# Patient Record
Sex: Male | Born: 1980 | Race: White | Hispanic: No | Marital: Single | Smoking: Never smoker
Health system: Southern US, Community
[De-identification: ages and names within clinical notes are randomized; demographics above are authoritative.]

## PROBLEM LIST (undated history)

## (undated) DIAGNOSIS — E785 Hyperlipidemia, unspecified: Secondary | ICD-10-CM

## (undated) DIAGNOSIS — L409 Psoriasis, unspecified: Secondary | ICD-10-CM

## (undated) DIAGNOSIS — I10 Essential (primary) hypertension: Secondary | ICD-10-CM

## (undated) DIAGNOSIS — G4733 Obstructive sleep apnea (adult) (pediatric): Secondary | ICD-10-CM

---

## 2018-12-10 ENCOUNTER — Other Ambulatory Visit: Payer: Self-pay

## 2018-12-10 ENCOUNTER — Ambulatory Visit: Payer: Self-pay | Admitting: Nurse Practitioner

## 2018-12-10 VITALS — BP 130/80 | HR 80 | Temp 98.2°F | Resp 20 | Wt 276.0 lb

## 2018-12-10 DIAGNOSIS — H1032 Unspecified acute conjunctivitis, left eye: Secondary | ICD-10-CM

## 2018-12-10 DIAGNOSIS — J019 Acute sinusitis, unspecified: Secondary | ICD-10-CM

## 2018-12-10 DIAGNOSIS — B9789 Other viral agents as the cause of diseases classified elsewhere: Secondary | ICD-10-CM

## 2018-12-10 MED ORDER — FLUTICASONE PROPIONATE 50 MCG/ACT NA SUSP: 2.0000 | Freq: Every day | NASAL | 0 refills | Status: DC

## 2018-12-10 MED ORDER — POLYMYXIN B-TRIMETHOPRIM 10000-0.1 UNIT/ML-% OP SOLN: 2.0000 [drp] | OPHTHALMIC | 0 refills | Status: AC

## 2018-12-10 MED ORDER — CETIRIZINE HCL 10 MG PO TABS: 10.0000 mg | ORAL_TABLET | Freq: Every day | ORAL | 0 refills | Status: DC

## 2018-12-10 MED ORDER — PSEUDOEPH-BROMPHEN-DM 30-2-10 MG/5ML PO SYRP: 5.0000 mL | ORAL_SOLUTION | Freq: Four times a day (QID) | ORAL | 0 refills | Status: AC | PRN

## 2018-12-10 NOTE — Patient Instructions (Addendum)
Sinusitis, Adult  -Take medication as prescribed. -Ibuprofen or Tylenol for pain, fever, or general discomfort. -Increase fluids. -Get plenty of rest. -Sleep elevated on at least 2 pillows at bedtime to help with cough. -Use a humidifier or vaporizer when at home and during sleep. -May use a teaspoon of honey or over-the-counter cough drops to help with cough. -May use normal saline nasal spray to help with nasal congestion throughout the day. -Follow-up if symptoms do not improve within the next 7-10 days.  Conjunctivitis:  -Use eyedrops as prescribed. -Apply a clean, cool compress to your eye for 10-20 minutes, 3-4 times a day. -Strict hand hygiene. -Do not rub or scratch the eyes. -Follow up with your PCP if symptoms do not improve or if there is a change in vision.   Sinusitis is inflammation of your sinuses. Sinuses are hollow spaces in the bones around your face. Your sinuses are located:  Around your eyes.  In the middle of your forehead.  Behind your nose.  In your cheekbones. Mucus normally drains out of your sinuses. When your nasal tissues become inflamed or swollen, mucus can become trapped or blocked. This allows bacteria, viruses, and fungi to grow, which leads to infection. Most infections of the sinuses are caused by a virus. Sinusitis can develop quickly. It can last for up to 4 weeks (acute) or for more than 12 weeks (chronic). Sinusitis often develops after a cold. What are the causes? This condition is caused by anything that creates swelling in the sinuses or stops mucus from draining. This includes:  Allergies.  Asthma.  Infection from bacteria or viruses.  Deformities or blockages in your nose or sinuses.  Abnormal growths in the nose (nasal polyps).  Pollutants, such as chemicals or irritants in the air.  Infection from fungi (rare). What increases the risk? You are more likely to develop this condition if you:  Have a weak body defense  system (immune system).  Do a lot of swimming or diving.  Overuse nasal sprays.  Smoke. What are the signs or symptoms? The main symptoms of this condition are pain and a feeling of pressure around the affected sinuses. Other symptoms include:  Stuffy nose or congestion.  Thick drainage from your nose.  Swelling and warmth over the affected sinuses.  Headache.  Upper toothache.  A cough that may get worse at night.  Extra mucus that collects in the throat or the back of the nose (postnasal drip).  Decreased sense of smell and taste.  Fatigue.  A fever.  Sore throat.  Bad breath. How is this diagnosed? This condition is diagnosed based on:  Your symptoms.  Your medical history.  A physical exam.  Tests to find out if your condition is acute or chronic. This may include: ? Checking your nose for nasal polyps. ? Viewing your sinuses using a device that has a light (endoscope). ? Testing for allergies or bacteria. ? Imaging tests, such as an MRI or CT scan. In rare cases, a bone biopsy may be done to rule out more serious types of fungal sinus disease. How is this treated? Treatment for sinusitis depends on the cause and whether your condition is chronic or acute.  If caused by a virus, your symptoms should go away on their own within 10 days. You may be given medicines to relieve symptoms. They include: ? Medicines that shrink swollen nasal passages (topical intranasal decongestants). ? Medicines that treat allergies (antihistamines). ? A spray that eases inflammation of  the nostrils (topical intranasal corticosteroids). ? Rinses that help get rid of thick mucus in your nose (nasal saline washes).  If caused by bacteria, your health care provider may recommend waiting to see if your symptoms improve. Most bacterial infections will get better without antibiotic medicine. You may be given antibiotics if you have: ? A severe infection. ? A weak immune system.   If caused by narrow nasal passages or nasal polyps, you may need to have surgery. Follow these instructions at home: Medicines  Take, use, or apply over-the-counter and prescription medicines only as told by your health care provider. These may include nasal sprays.  If you were prescribed an antibiotic medicine, take it as told by your health care provider. Do not stop taking the antibiotic even if you start to feel better. Hydrate and humidify   Drink enough fluid to keep your urine pale yellow. Staying hydrated will help to thin your mucus.  Use a cool mist humidifier to keep the humidity level in your home above 50%.  Inhale steam for 10-15 minutes, 3-4 times a day, or as told by your health care provider. You can do this in the bathroom while a hot shower is running.  Limit your exposure to cool or dry air. Rest  Rest as much as possible.  Sleep with your head raised (elevated).  Make sure you get enough sleep each night. General instructions   Apply a warm, moist washcloth to your face 3-4 times a day or as told by your health care provider. This will help with discomfort.  Wash your hands often with soap and water to reduce your exposure to germs. If soap and water are not available, use hand sanitizer.  Do not smoke. Avoid being around people who are smoking (secondhand smoke).  Keep all follow-up visits as told by your health care provider. This is important. Contact a health care provider if:  You have a fever.  Your symptoms get worse.  Your symptoms do not improve within 10 days. Get help right away if:  You have a severe headache.  You have persistent vomiting.  You have severe pain or swelling around your face or eyes.  You have vision problems.  You develop confusion.  Your neck is stiff.  You have trouble breathing. Summary  Sinusitis is soreness and inflammation of your sinuses. Sinuses are hollow spaces in the bones around your face.  This  condition is caused by nasal tissues that become inflamed or swollen. The swelling traps or blocks the flow of mucus. This allows bacteria, viruses, and fungi to grow, which leads to infection.  If you were prescribed an antibiotic medicine, take it as told by your health care provider. Do not stop taking the antibiotic even if you start to feel better.  Keep all follow-up visits as told by your health care provider. This is important. This information is not intended to replace advice given to you by your health care provider. Make sure you discuss any questions you have with your health care provider. Document Released: 09/12/2005 Document Revised: 02/12/2018 Document Reviewed: 02/12/2018 Else[employer] Interactive Patient Education  2019 [employer]  Bacterial Conjunctivitis, Adult Bacterial conjunctivitis is an infection of the clear membrane that covers the white part of your eye and the inner surface of your eyelid (conjunctiva). When the blood vessels in your conjunctiva become inflamed, your eye becomes red or pink, and it will probably feel itchy. Bacterial conjunctivitis spreads very easily from person to person (is  contagious). It also spreads easily from one eye to the other eye. What are the causes? This condition is caused by bacteria. You may get the infection if you come into close contact with:  A person who is infected with the bacteria.  Items that are contaminated with the bacteria, such as a face towel, contact lens solution, or eye makeup. What increases the risk? You are more likely to develop this condition if you:  Are exposed to other people who have the infection.  Wear contact lenses.  Have a sinus infection.  Have had a recent eye injury or surgery.  Have a weak body defense system (immune system).  Have a medical condition that causes dry eyes. What are the signs or symptoms? Symptoms of this condition include:  Thick, yellowish discharge from the eye.  This may turn into a crust on the eyelid overnight and cause your eyelids to stick together.  Tearing or watery eyes.  Itchy eyes.  Burning feeling in your eyes.  Eye redness.  Swollen eyelids.  Blurred vision. How is this diagnosed? This condition is diagnosed based on your symptoms and medical history. Your health care provider may also take a sample of discharge from your eye to find the cause of your infection. This is rarely done. How is this treated? This condition may be treated with:  Antibiotic eye drops or ointment to clear the infection more quickly and prevent the spread of infection to others.  Oral antibiotic medicines to treat infections that do not respond to drops or ointments or that last longer than 10 days.  Cool, wet cloths (cool compresses) placed on the eyes.  Artificial tears applied 2-6 times a day. Follow these instructions at home: Medicines  Take or apply your antibiotic medicine as told by your health care provider. Do not stop taking or applying the antibiotic even if you start to feel better.  Take or apply over-the-counter and prescription medicines only as told by your health care provider.  Be very careful to avoid touching the edge of your eyelid with the eye-drop bottle or the ointment tube when you apply medicines to the affected eye. This will keep you from spreading the infection to your other eye or to other people. Managing discomfort  Gently wipe away any drainage from your eye with a warm, wet washcloth or a cotton ball.  Apply a clean, cool compress to your eye for 10-20 minutes, 3-4 times a day. General instructions  Do not wear contact lenses until the inflammation is gone and your health care provider says it is safe to wear them again. Ask your health care provider how to sterilize or replace your contact lenses before you use them again. Wear glasses until you can resume wearing contact lenses.  Avoid wearing eye makeup until  the inflammation is gone. Throw away any old eye cosmetics that may be contaminated.  Change or wash your pillowcase every day.  Do not share towels or washcloths. This may spread the infection.  Wash your hands often with soap and water. Use paper towels to dry your hands.  Avoid touching or rubbing your eyes.  Do not drive or use heavy machinery if your vision is blurred. Contact a health care provider if:  You have a fever.  Your symptoms do not get better after 10 days. Get help right away if you have:  A fever and your symptoms suddenly get worse.  Severe pain when you move your eye.  Facial pain,  redness, or swelling.  Sudden loss of vision. Summary  Bacterial conjunctivitis is an infection of the clear membrane that covers the white part of your eye and the inner surface of your eyelid (conjunctiva).  Bacterial conjunctivitis spreads very easily from person to person (is contagious).  Wash your hands often with soap and water. Use paper towels to dry your hands.  Take or apply your antibiotic medicine as told by your health care provider. Do not stop taking or applying the antibiotic even if you start to feel better.  Contact a health care provider if you have a fever or your symptoms do not get better after 10 days. This information is not intended to replace advice given to you by your health care provider. Make sure you discuss any questions you have with your health care provider. Document Released: 09/12/2005 Document Revised: 04/18/2018 Document Reviewed: 04/18/2018 Elsevier Interactive Patient Education  2019 [employer]

## 2018-12-10 NOTE — Progress Notes (Signed)
MRN: [id number] DOB: May 08, 1981  Subjective:   Kevin Rowland is a 38 y.o. male presenting for chief complaint of Cough (4 DAYS); SNEEZING (4 DAYS); Eye Problem (1 DAY, LEFT EYE, ITCHY, PINK, DRAINAGE, CRUSTED ); and Nasal Congestion (4 DAYS )   Reports a 4 day history of sinus headache, sinus congestion , sinus pain, sore throat and productive cough, and a 1 day history of left eye redness, itching, crusting, drainage. Has tried Mucinex  for relief. Denies fever, ear fullness, ear drainage, difficulty swallowing, inability to swallow, voice change, wheezing, shortness of breath, chest tightness and chest pain, chills, decreased appetite, nausea, vomiting, abdominal pain and diarrhea. Has not had had sick contacts with influenza or strep. Denies history of seasonal allergies,denies history of asthma. Patient has had flu shot this season. Denies smoking.  Patient currently takes Humira for colitis. Denies recent travel. Denies any other aggravating or relieving factors, no other questions or concerns.  Review of Systems  Constitutional: Positive for malaise/fatigue. Negative for chills and fever.  HENT: Positive for congestion, sinus pain and sore throat. Negative for ear discharge and ear pain.        +postnasal drainage  Eyes: Positive for blurred vision, discharge and redness. Negative for double vision, photophobia and pain.       Left eye, crusting  Respiratory: Positive for cough and sputum production. Negative for shortness of breath and wheezing.   Cardiovascular: Negative.   Skin: Negative.   Neurological: Positive for speech change and headaches. Negative for dizziness, tingling, tremors, sensory change and focal weakness.  Endo/Heme/Allergies: Negative for environmental allergies.    Kevin Rowland has a current medication list which includes the following prescription(s): adalimumab and pravastatin. Also has no allergies on file.  Kevin Rowland  has no past medical history on file. Also  has no  past surgical history on file.   Objective:   Vitals: BP 130/80 (BP Location: Right Arm, Patient Position: Sitting, Cuff Size: Large)   Pulse 80   Temp 98.2 F (36.8 C) (Oral)   Resp 20   Wt 276 lb (125.2 kg)   SpO2 98%   Physical Exam Vitals signs reviewed.  Constitutional:      General: He is not in acute distress. HENT:     Head: Normocephalic.     Right Ear: Tympanic membrane, ear canal and external ear normal.     Left Ear: Tympanic membrane, ear canal and external ear normal.     Nose: Mucosal edema, congestion (moderate) and rhinorrhea ( clear nasal drainage) present.     Right Turbinates: Enlarged and swollen.     Left Turbinates: Enlarged and swollen.     Right Sinus: Maxillary sinus tenderness and frontal sinus tenderness present.     Left Sinus: Maxillary sinus tenderness and frontal sinus tenderness present.     Mouth/Throat:     Lips: Pink.     Mouth: Mucous membranes are moist.     Pharynx: Uvula midline. Posterior oropharyngeal erythema present. No pharyngeal swelling or oropharyngeal exudate.     Tonsils: No tonsillar exudate. Swelling: 0 on the right. 0 on the left.  Eyes:     General: Lids are normal. Lids are everted, no foreign bodies appreciated. Vision grossly intact. Gaze aligned appropriately.        Left eye: Discharge ( yellow discharge to inner canthus) present.    Extraocular Movements: Extraocular movements intact.     Conjunctiva/sclera:     Left eye: Left conjunctiva is injected.  Pupils: Pupils are equal, round, and reactive to light.  Neurological:     Mental Status: He is alert.     Assessment and Plan :   Exam findings, diagnosis etiology and medication use and indications reviewed with patient. Follow- Up and discharge instructions provided. No emergent/urgent issues found on exam. Patient's URI symptoms and physical exam findings are consistent with that of viral etiology.  Patient does not display fever, purulent nasal drainage,  nasal obstruction, or facial pain to date. I will provide the patient with symptomatic treatment for his cough and nasal congestion to include Zyrtec, Bromfed and Flonase. I am going to treat the patient's bacterial conjunctivitis with polytrim.  Patient will treat both eyes and follow additional instructions provided at home.  Discussed with patient that antibiotics are not necessary at this time and felt his symptoms could respond to symptomatic treatment at this time.  Patient is in agreement with this plan.  Informed the patient that if her symptoms do not improve within the next 7-10 days or worsen, an antibiotic may be appropriate at that time. I would recommend Augmentin for 7 days if needed.  Patient education was provided. Patient verbalized understanding of information provided and agrees with plan of care (POC), all questions answered. The patient is advised to call or return to clinic if conditiondoes not see an improvement in symptoms, or to seek the care of the closest emergency department if conditionworsens with the above plan.  1. Acute viral sinusitis  - fluticasone (FLONASE) 50 MCG/ACT nasal spray; Place 2 sprays into both nostrils daily for 10 days.  Dispense: 16 g; Refill: 0 - brompheniramine-pseudoephedrine-DM 30-2-10 MG/5ML syrup; Take 5 mLs by mouth 4 (four) times daily as needed for up to 7 days.  Dispense: 150 mL; Refill: 0 - cetirizine (ZYRTEC) 10 MG tablet; Take 1 tablet (10 mg total) by mouth daily for 30 days.  Dispense: 30 tablet; Refill: 0 -Take medication as prescribed. -Ibuprofen or Tylenol for pain, fever, or general discomfort. -Increase fluids. -Get plenty of rest. -Sleep elevated on at least 2 pillows at bedtime to help with cough. -Use a humidifier or vaporizer when at home and during sleep. -May use a teaspoon of honey or over-the-counter cough drops to help with cough. -May use normal saline nasal spray to help with nasal congestion throughout the  day. -Follow-up if symptoms do not improve or rapidly worsen within the next 7-10 days.  2. Acute bacterial conjunctivitis of left eye  - trimethoprim-polymyxin b (POLYTRIM) ophthalmic solution; Place 2 drops into both eyes every 4 (four) hours for 7 days.  Dispense: 10 mL; Refill: 0 -Use eyedrops as prescribed. -Apply a clean, cool compress to your eye for 10-20 minutes, 3-4 times a day. -Strict hand hygiene. -Do not rub or scratch the eyes. -Follow up with your PCP if symptoms do not improve or if there is a change in vision.

## 2018-12-12 ENCOUNTER — Telehealth: Payer: Self-pay

## 2018-12-12 NOTE — Telephone Encounter (Signed)
Patient states he is feeling better

## 2019-09-05 ENCOUNTER — Encounter: Payer: Self-pay | Admitting: Neurology

## 2019-09-05 ENCOUNTER — Other Ambulatory Visit: Payer: Self-pay

## 2019-09-05 ENCOUNTER — Ambulatory Visit (INDEPENDENT_AMBULATORY_CARE_PROVIDER_SITE_OTHER): Payer: 59 | Admitting: Neurology

## 2019-09-05 VITALS — BP 142/92 | HR 88 | Temp 97.8°F | Ht 68.0 in | Wt 291.0 lb

## 2019-09-05 DIAGNOSIS — G4733 Obstructive sleep apnea (adult) (pediatric): Secondary | ICD-10-CM

## 2019-09-05 DIAGNOSIS — Z9989 Dependence on other enabling machines and devices: Secondary | ICD-10-CM

## 2019-09-05 NOTE — Progress Notes (Signed)
SLEEP MEDICINE CLINIC    Provider:  Melvyn Novas, MD  Primary Care Physician:  Lonell Face, MD [address] [place] [address]     Referring Provider: Irene Limbo, D[address] [place],  [place] [address]          Chief Complaint according to patient   Patient presents with:    . New Patient (Initial Visit)           HISTORY OF PRESENT ILLNESS:  Kevin Rowland is a 38 y.o. year old Caucasian male patient seen here on 09/05/2019    Chief concern according to patient :   Mr. Kevin Rowland underwent sleep testing on 18 November 2010 at [facility] neurology at the time Mr. Kevin Rowland as the attending technician's physician was C. Para March, DO he has also been followed by Ephraim Hamburger, MD. A family member had told him about his loud snoring so he was referred for evaluation.  The first sleep  Sleep study 2008.  The  2012 sleep study showed a sleep efficiency of 90% no significant hypoxemia.  He did have however very low nadir of oxygen saturation in rem sleep and in supine sleep.  The nadir was 61% for supine REM sleep and 62% for supine non-REM sleep.  His AHI was very high at 92.7 events per hour of sleep REM AHI was 102.9, the vast majority were hypopneas and obstructive apneas there was a small percentage 11.6 of mixed apneas.  He was titrated beginning at 4 and ending at 9 cm water pressure with an AHI of 4.5 /h.  He uses nasal pillows, is a restless sleeper and loses air seal. He developed a sore spot at the bottom pf the left nostril and looks for an alternative.   Mr. Kevin Rowland is a highly compliant CPAP user and the data collection including today 09-05-2019 showed 30 out of 30 days of CPAP use each of those days over 4 hours with an average of 6 hours 41 minutes.   He is using an AutoSet 10 machine with a serial #[id number].  The minimum pressure is set at 4 and the maximum pressure at 16 was 2 cm EPR the 95th percentile pressure is  14.9.   He still has a residual AHI of 3.2 which is satisfying but this is mostly obstructive apnea is 2.7 out of these AHI.  A further increase and probably a higher minimum pressure is indicated.  We will also try to find the most comfortable full facemask for him today.    I have the pleasure of seeing Kevin Rowland today, a right-handed White or Caucasian male with a possible sleep disorder.  He  has no past medical history on file.. Left eye dominance.    Family medical /sleep history: no other family member on CPAP with OSA, insomnia, sleep walkers.   Social history:  Patient is working as Education officer, environmental and lives in a household alone. The patient currently works daytime .Pets are not present. Tobacco use : never .  ETOH use; seldomly  Caffeine intake in form of Coffee(none ) Soda( 2-3 ( Tea ( sweet ) no energy drinks. Regular exercise in form of N/A  Hobbies : N/A    Sleep habits are as follows: The patient's dinner time is between 7 PM. The patient goes to bed at 12 PM and continues to sleep for 7 hours.The preferred sleep position is laterally, prone and sometimes supine.  with the support of 2 pillow.  He sleeps in a queen bed. Dreams are reportedly inrequent/vivid.   7.30 AM is the usual rise time. The patient wakes up with an alarm.  He reports not feeling refreshed or restored in AM, with symptoms such as dry mouth and residual fatigue. He feels sleepy after lunch. He is a mouth breather - and uses a humidifier.   Naps are taken rarely.   Review of Systems: Out of a complete 14 system review, the patient complains of only the following symptoms, and all other reviewed systems are negative.:   Obesity. No SOB in daytime.  Fatigue, sleepiness , snoring,   How likely are you to doze in the following situations: 0 = not likely, 1 = slight chance, 2 = moderate chance, 3 = high chance   Sitting and Reading? Watching Television? Sitting inactive in a public place (theater or  meeting)? As a passenger in a car for an hour without a break? Lying down in the afternoon when circumstances permit? Sitting and talking to someone? Sitting quietly after lunch without alcohol? In a car, while stopped for a few minutes in traffic?   Total = 3/ 24 points   FSS endorsed at 12/ 63 points.   Social History   Socioeconomic History  . Marital status: Unknown    Spouse name: Not on file  . Number of children: Not on file  . Years of education: Not on file  . Highest education level: Not on file  Occupational History  . Not on file  Tobacco Use  . Smoking status: Never Smoker  . Smokeless tobacco: Never Used  Substance and Sexual Activity  . Alcohol use: Not Currently  . Drug use: Not Currently  . Sexual activity: Not on file  Other Topics Concern  . Not on file  Social History Narrative  . Not on file   Social Determinants of Health   Financial Resource Strain:   . Difficulty of Paying Living Expenses: Not on file  Food Insecurity:   . Worried About Charity fundraiser in the Last Year: Not on file  . Ran Out of Food in the Last Year: Not on file  Transportation Needs:   . Lack of Transportation (Medical): Not on file  . Lack of Transportation (Non-Medical): Not on file  Physical Activity:   . Days of Exercise per Week: Not on file  . Minutes of Exercise per Session: Not on file  Stress:   . Feeling of Stress : Not on file  Social Connections:   . Frequency of Communication with Friends and Family: Not on file  . Frequency of Social Gatherings with Friends and Family: Not on file  . Attends Religious Services: Not on file  . Active Member of Clubs or Organizations: Not on file  . Attends Archivist Meetings: Not on file  . Marital Status: Not on file    No family history on file.  No past medical history on file.     Current Outpatient Medications on File Prior to Visit  Medication Sig Dispense Refill  . Adalimumab (HUMIRA PEN) 40  MG/0.4ML PNKT Inject 40 mg into the skin.    Marland Kitchen icosapent Ethyl (VASCEPA) 1 g capsule Take 2 g by mouth 2 (two) times daily.    Marland Kitchen loratadine (CLARITIN) 10 MG tablet Take by mouth.    . Multiple Vitamin (MULTIVITAMIN) tablet Take 1 tablet by mouth daily.    . pravastatin (PRAVACHOL) 40  MG tablet Take 40 mg by mouth daily.     No current facility-administered medications on file prior to visit.    Physical exam:  Today's Vitals   09/05/19 1508  BP: (!) 142/92  Pulse: 88  Temp: 97.8 F (36.6 C)  Weight: 291 lb (132 kg)  Height: 5\' 8"  (1.727 m)   Body mass index is 44.25 kg/m.   Wt Readings from Last 3 Encounters:  09/05/19 291 lb (132 kg)  12/10/18 276 lb (125.2 kg)     Ht Readings from Last 3 Encounters:  09/05/19 5\' 8"  (1.727 m)      General: The patient is awake, alert and appears not in acute distress. The patient is well groomed. Head: Normocephalic, atraumatic. Neck is supple. Mallampati 4 ,  neck circumference:20.5  inches .  Nasal airflow restricted patent.   Retrognathia is present.  Dental status:   Cardiovascular:  Regular rate and cardiac rhythm by pulse,  without distended neck veins. Respiratory: Lungs are clear to auscultation.  Skin:  Without evidence of ankle edema, or rash. Trunk: The patient's posture is erect.   Neurologic exam : The patient is awake and alert, oriented to place and time.   Memory subjective described as intact.  Attention span & concentration ability appears normal.  Speech is fluent,  without  dysarthria, dysphonia or aphasia.  Mood and affect are appropriate.   Cranial nerves: no loss of smell or taste reported  Pupils are equal and briskly reactive to light. Funduscopic exam: deferred.   Extraocular movements in vertical and horizontal planes were intact and without nystagmus. No Diplopia. Visual fields by finger perimetry are intact. Hearing was intact to soft voice and finger rubbing.    Facial sensation intact to fine  touch.  Facial motor strength is symmetric and tongue and uvula move midline.  Neck ROM : rotation, tilt and flexion extension were normal for age and shoulder shrug was symmetrical.    Motor exam:  Symmetric bulk, tone and ROM.   Normal tone without cog wheeling, symmetric grip strength . Sensory:  Fine touch, pinprick and vibration were tested  and  normal.  Proprioception tested in the upper extremities was normal. Coordination: Rapid alternating movements in the fingers/hands were of normal speed.  The Finger-to-nose maneuver was intact without evidence of ataxia, dysmetria or tremor.   Gait and station: Patient could rise unassisted from a seated position.  Toe and heel walk were deferred.  Deep tendon reflexes: in the  upper and lower extremities are symmetric and intact.  Babinski response was deferred.       After spending a total time of 35 minutes face to face and additional time for physical and neurologic examination, review of laboratory studies,  personal review of imaging studies, reports and results of other testing and review of referral information / records as far as provided in visit, I have established the following assessments:  1) mainly curled reason as to get new supplies for Kevin Rowland but also to find the best possible interface to not further aggravate the blister that has developed under his left nostril and at the angle of the nose.  He has been a highly compliant CPAP and I explained that he can try a full facemask to give that area with some rest that does not mean that he could not return to a nasal pillow or nasal cradle in the future.  He was originally only titrated to 9 cmH2O but he is now using  it 10 cm pressure on his auto titration device his baseline spoke of severe apnea and at that time he endorsed the Epworth Sleepiness Scale at 15 points much higher than today.  CPAP has definitely benefited him and he is not looking for alternative treatments but for  a more comfortable CPAP experience.     My Plan is to proceed with:  I would concentrate today on the mask fit with the patient and I will involve my sleep lab into this question.   I will increase his upper limit of CPAP to 18 cm water.    In addition I will refer him for a medical weight management with Dr. Orlene Plum and company.  He has had nutritional advice has seen a nutritionist after referral by primary care.  He is well aware that obesity and inflammation are closely correlated but he has not gotten the encouragement and coaching that would help him to lose weight on a long-lasting lifestyle change.  I would like to thank Fischer, Rockwell Alexandria, MD and Irene Limbo, [address] [place],  [place] [address] for allowing me to meet with and to take care of this pleasant patient.   In short, Kevin Rowland is presenting with nasal blisters - related to CPAP use. ,   Electronically signed by: Melvyn Novas, MD 09/05/2019 3:21 PM  [facility] Sleep Board certified by The [employer] of Sleep Medicine and Diplomate of the [employer] of Sleep Medicine. Board certified In Neurology through the ABPN, Fellow of the [employer] of Neurology. Medical Director of [employer].

## 2019-09-05 NOTE — Patient Instructions (Signed)

## 2020-02-29 ENCOUNTER — Emergency Department (HOSPITAL_COMMUNITY)
Admission: EM | Admit: 2020-02-29 | Discharge: 2020-02-29 | Disposition: A | Discharge status: Home / Self Care | Payer: 59 | Attending: Emergency Medicine | Admitting: Emergency Medicine

## 2020-02-29 ENCOUNTER — Emergency Department (HOSPITAL_COMMUNITY): Payer: 59

## 2020-02-29 ENCOUNTER — Other Ambulatory Visit: Payer: Self-pay

## 2020-02-29 DIAGNOSIS — I160 Hypertensive urgency: Secondary | ICD-10-CM | POA: Diagnosis not present

## 2020-02-29 DIAGNOSIS — R079 Chest pain, unspecified: Secondary | ICD-10-CM | POA: Insufficient documentation

## 2020-02-29 DIAGNOSIS — R0789 Other chest pain: Secondary | ICD-10-CM

## 2020-02-29 LAB — BASIC METABOLIC PANEL
Anion gap: 10 (ref 5–15)
BUN: 10 mg/dL (ref 6–20)
CO2: 23 mmol/L (ref 22–32)
Calcium: 9.6 mg/dL (ref 8.9–10.3)
Chloride: 106 mmol/L (ref 98–111)
Creatinine, Ser: 0.83 mg/dL (ref 0.61–1.24)
GFR calc Af Amer: >60 mL/min (ref >60)
GFR calc non Af Amer: >60 mL/min (ref >60)
Glucose, Bld: 97 mg/dL (ref 70–99)
Potassium: 3.7 mmol/L (ref 3.5–5.1)
Sodium: 139 mmol/L (ref 135–145)

## 2020-02-29 LAB — CBC
HCT: 45.1 % (ref 39.0–52.0)
Hemoglobin: 15.3 g/dL (ref 13.0–17.0)
MCH: 29.8 pg (ref 26.0–34.0)
MCHC: 33.9 g/dL (ref 30.0–36.0)
MCV: 87.7 fL (ref 80.0–100.0)
Platelets: 275 10*3/uL (ref 150–400)
RBC: 5.14 MIL/uL (ref 4.22–5.81)
RDW: 12.4 % (ref 11.5–15.5)
WBC: 9.4 10*3/uL (ref 4.0–10.5)
nRBC: 0 % (ref 0.0–0.2)

## 2020-02-29 LAB — TROPONIN I (HIGH SENSITIVITY)
Troponin I (High Sensitivity): 5 ng/L (ref <18)
Troponin I (High Sensitivity): 3 ng/L (ref <18)

## 2020-02-29 MED ORDER — LISINOPRIL 10 MG PO TABS
10.0000 mg | ORAL_TABLET | Freq: Once | ORAL | Status: AC
  Administered 2020-02-29: 10 mg via ORAL
  Filled 2020-02-29: qty 1

## 2020-02-29 MED ORDER — LISINOPRIL 10 MG PO TABS
10.0000 mg | ORAL_TABLET | Freq: Every day | ORAL | 0 refills | Status: AC
Start: 2020-02-29 — End: ?

## 2020-02-29 MED ORDER — SODIUM CHLORIDE 0.9% FLUSH: 3.0000 mL | Freq: Once | INTRAVENOUS | Status: DC

## 2020-02-29 NOTE — Discharge Instructions (Signed)
As discussed, your evaluation today has been largely reassuring.  But, it is important that you monitor your condition carefully, and do not hesitate to return to the ED if you develop new, or concerning changes in your condition. ? ?Otherwise, please follow-up with your physician for appropriate ongoing care. ? ?

## 2020-02-29 NOTE — ED Notes (Signed)
Patient given discharge instructions. Questions were answered. Patient verbalized understanding of discharge instructions and care at home.  

## 2020-02-29 NOTE — ED Triage Notes (Signed)
Patient complains of not feeling well for the past week. Complains of CP and cough, describes the pain as a tightness. NAD

## 2020-02-29 NOTE — ED Provider Notes (Signed)
[facility] EMERGENCY DEPARTMENT Provider Note   CSN: [id number] Arrival date & time: 02/29/20  0272     History No chief complaint on file.   Kevin Rowland is a 39 y.o. male.  HPI    Patient presents with concern of chest pain. Patient has history of hypercholesterolemia, takes dual therapy.  Beyond this, no acknowledge medical problems. Patient has no history of cardiac disease. He notes that he has been feeling slightly unwell for several weeks, but not to the past day or so as he had a chest tightness, described as not painful, but tightness throughout the thorax.  Symptoms may be improved with ambulation, or otherwise not notably different with any particular activity, intervention. No fever, no nausea, vomiting, diarrhea. Patient does not smoke, does not drink. No past medical history on file.  There are no problems to display for this patient.   No past surgical history on file.     No family history on file.  Social History   Tobacco Use  . Smoking status: Never Smoker  . Smokeless tobacco: Never Used  Substance Use Topics  . Alcohol use: Not Currently  . Drug use: Not Currently    Home Medications Prior to Admission medications   Medication Sig Start Date End Date Taking? Authorizing Provider  Adalimumab (HUMIRA PEN) 40 MG/0.4ML PNKT Inject 40 mg into the skin. 07/10/19   [provider]  icosapent Ethyl (VASCEPA) 1 g capsule Take 2 g by mouth 2 (two) times daily.    [provider]  loratadine (CLARITIN) 10 MG tablet Take by mouth.    [provider]  Multiple Vitamin (MULTIVITAMIN) tablet Take 1 tablet by mouth daily.    [provider]  pravastatin (PRAVACHOL) 40 MG tablet Take 40 mg by mouth daily.    [provider]    Allergies    Patient has no allergy information on record.  Review of Systems   Review of Systems  Constitutional:       Per HPI, otherwise negative  HENT:   Per HPI, otherwise negative  Respiratory:       Per HPI, otherwise negative  Cardiovascular:       Per HPI, otherwise negative  Gastrointestinal: Negative for vomiting.  Endocrine:       Negative aside from HPI  Genitourinary:       Neg aside from HPI   Musculoskeletal:       Per HPI, otherwise negative  Skin: Negative.   Neurological: Negative for syncope.    Physical Exam Updated Vital Signs BP 134/86   Pulse 66   Temp 97.6 F (36.4 C) (Oral)   Resp 19   SpO2 94%   Physical Exam Vitals and nursing note reviewed.  Constitutional:      General: He is not in acute distress.    Appearance: He is well-developed.  HENT:     Head: Normocephalic and atraumatic.  Eyes:     Conjunctiva/sclera: Conjunctivae normal.  Cardiovascular:     Rate and Rhythm: Normal rate and regular rhythm.  Pulmonary:     Effort: Pulmonary effort is normal. No respiratory distress.     Breath sounds: No stridor.  Abdominal:     General: There is no distension.  Skin:    General: Skin is warm and dry.  Neurological:     Mental Status: He is alert and oriented to person, place, and time.     ED Results / Procedures / Treatments  Labs (all labs ordered are listed, but only abnormal results are displayed) Labs Reviewed  BASIC METABOLIC PANEL  CBC  TROPONIN I (HIGH SENSITIVITY)  TROPONIN I (HIGH SENSITIVITY)    EKG EKG Interpretation  Date/Time:  Saturday February 29 2020 09:22:55 EDT Ventricular Rate:  75 PR Interval:  134 QRS Duration: 96 QT Interval:  386 QTC Calculation: 431 R Axis:   37 Text Interpretation: Normal sinus rhythm Cannot rule out Anterior infarct , age undetermined Abnormal ECG Confirmed by Carmin Muskrat [phone]) on 02/29/2020 11:37:14 AM   Radiology DG Chest 2 View  Result Date: 02/29/2020 CLINICAL DATA:  Patient complains of not feeling well for the past week. Complains of CP and cough, describes the pain as a tightness EXAM: CHEST - 2 VIEW COMPARISON:  None.  FINDINGS: Normal heart, mediastinum and hila. Clear lungs.  No pleural effusion or pneumothorax. Skeletal structures are unremarkable. IMPRESSION: No active cardiopulmonary disease. Electronically Signed   By: Lajean Manes M.D.   On: 02/29/2020 10:25    Procedures Procedures (including critical care time)  Medications Ordered in ED Medications  sodium chloride flush (NS) 0.9 % injection 3 mL (has no administration in time range)  lisinopril (ZESTRIL) tablet 10 mg (has no administration in time range)    ED Course  I have reviewed the triage vital signs and the nursing notes.  Pertinent labs & imaging results that were available during my care of the patient were reviewed by me and considered in my medical decision making (see chart for details).    2:11 PM Patient awake, alert, speaking clearly.  Blood pressure mains elevated with a diastolic greater than 614.  No evidence for endorgan damage on lab review, no evidence for ACS with 2 normal troponin, nonischemic EKG.  No evidence for infection with unremarkable labs, no fever.  After lengthy conversation about today's findings, including consideration of his persistently elevated blood pressure, patient is comfortable with, appropriate for discharge with initiation of antihypertensive, close outpatient follow-up. Final Clinical Impression(s) / ED Diagnoses Final diagnoses:  Atypical chest pain  Hypertensive urgency    Rx / DC Orders ED Discharge Orders         Ordered    lisinopril (ZESTRIL) 10 MG tablet  Daily     02/29/20 1414           Carmin Muskrat, MD 02/29/20 1414

## 2020-03-19 ENCOUNTER — Ambulatory Visit: Payer: 59 | Admitting: Adult Health

## 2021-12-26 IMAGING — CR DG CHEST 2V
2 series · 2 of 2 positions shown · non-contrast
Comparison: None.

CLINICAL DATA: Patient complains of not feeling well for the past
week. Complains of CP and cough, describes the pain as a tightness

EXAM:
CHEST - 2 VIEW

[chest pa]
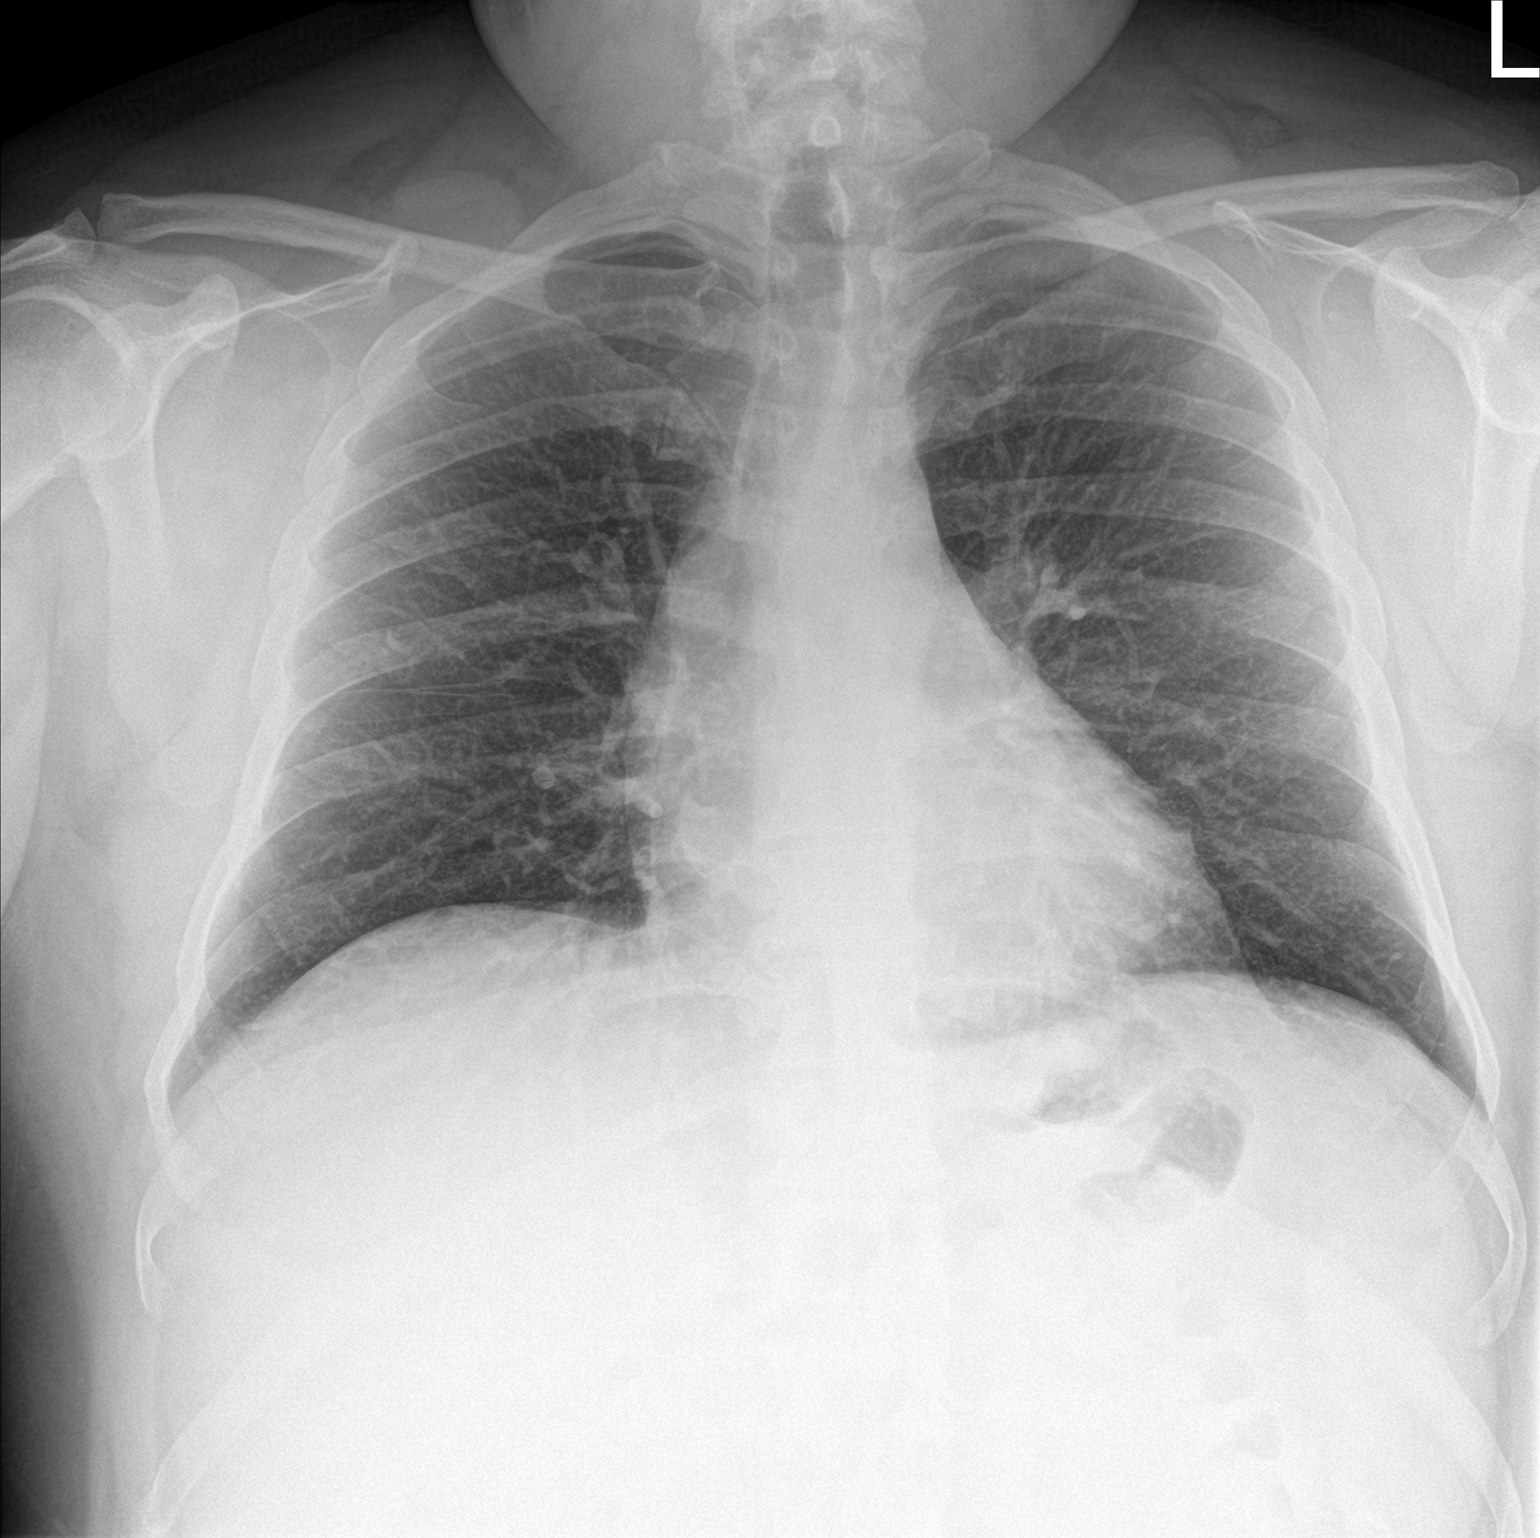

[chest lat]
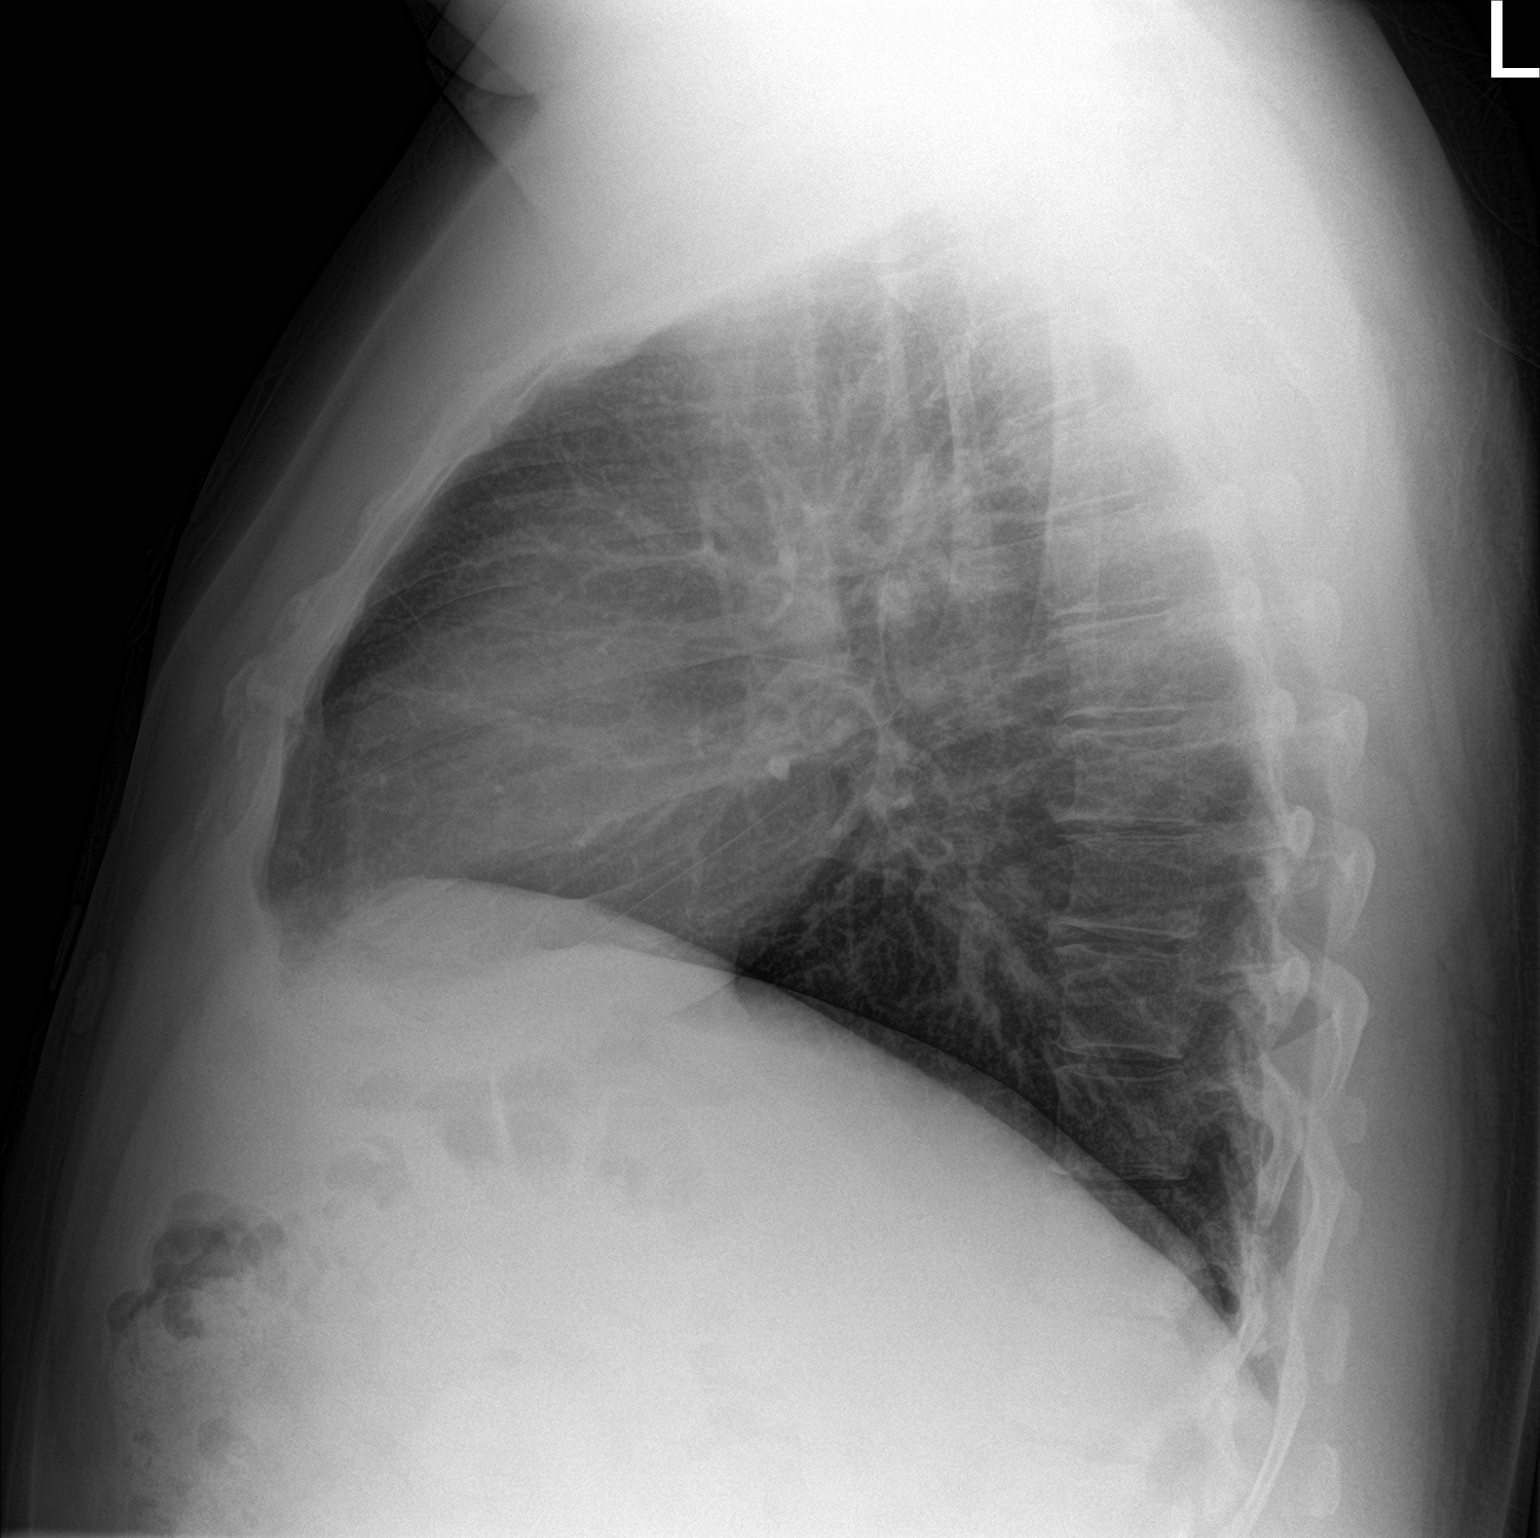

[2 of 2 positions shown; findings below may reference images not displayed]

FINDINGS: Normal heart, mediastinum and hila.

Clear lungs.  No pleural effusion or pneumothorax.

Skeletal structures are unremarkable.
IMPRESSION: No active cardiopulmonary disease.

## 2024-05-06 ENCOUNTER — Encounter (HOSPITAL_BASED_OUTPATIENT_CLINIC_OR_DEPARTMENT_OTHER): Payer: Self-pay

## 2024-05-06 ENCOUNTER — Emergency Department (HOSPITAL_BASED_OUTPATIENT_CLINIC_OR_DEPARTMENT_OTHER): Admitting: Radiology

## 2024-05-06 ENCOUNTER — Emergency Department (HOSPITAL_BASED_OUTPATIENT_CLINIC_OR_DEPARTMENT_OTHER)
Admission: EM | Admit: 2024-05-06 | Discharge: 2024-05-06 | Disposition: A | Discharge status: Home / Self Care | Attending: Emergency Medicine | Admitting: Emergency Medicine

## 2024-05-06 ENCOUNTER — Other Ambulatory Visit: Payer: Self-pay

## 2024-05-06 DIAGNOSIS — R059 Cough, unspecified: Secondary | ICD-10-CM | POA: Diagnosis present

## 2024-05-06 HISTORY — DX: Obstructive sleep apnea (adult) (pediatric): G47.33

## 2024-05-06 HISTORY — DX: Hyperlipidemia, unspecified: E78.5

## 2024-05-06 HISTORY — DX: Essential (primary) hypertension: I10

## 2024-05-06 HISTORY — DX: Psoriasis, unspecified: L40.9

## 2024-05-06 NOTE — ED Triage Notes (Signed)
 Taking supplement pill approx 1 hours ago. Worries that he inhaled a pill. Coughed but did not vomit. Denies SOB or chest pain.

## 2024-05-06 NOTE — Discharge Instructions (Signed)
 As discussed, your visit to the ED was reassuring.  Chest x-ray appeared normal.  Return if you develop chest pain, shortness of breath, tractable cough or other abnormality discussed.

## 2024-05-06 NOTE — ED Provider Notes (Signed)
  EMERGENCY DEPARTMENT AT [facility] Provider Note   CSN: [id number] Arrival date & time: 05/06/24  1109     Patient presents with: Aspiration   Kevin Rowland is a 43 y.o. male.   HPI   43 year old male presents emergency department with concern for possible aspiration.  1 hour ago was taking a probiotic supplement when he felt like he may have aspirated after looking up something online.  States that he did not have a coughing episode afterwards but tried to cough to clear anything that he may have accidentally aspirated.  Denies any fever, cough, chest pain, shortness of breath, abdominal pain, nausea, vomiting.  States that he currently has no symptoms.  Presents the ED for assessment/evaluation.  Past medical history significant for hyperlipidemia, hypertension, OSA, psoriasis  Prior to Admission medications   Medication Sig Start Date End Date Taking? Authorizing Provider  Adalimumab (HUMIRA PEN) 40 MG/0.4ML PNKT Inject 40 mg into the skin. 07/10/19  Yes [provider]  fenofibrate (TRICOR) 48 MG tablet Take 48 mg by mouth daily.   Yes [provider]  lisinopril  (ZESTRIL ) 10 MG tablet Take 1 tablet (10 mg total) by mouth daily. Patient taking differently: Take 20 mg by mouth daily. 02/29/20  Yes Garrick Charleston, MD  loratadine (CLARITIN) 10 MG tablet Take by mouth.   Yes [provider]  Multiple Vitamin (MULTIVITAMIN) tablet Take 1 tablet by mouth daily.   Yes [provider]  pravastatin (PRAVACHOL) 40 MG tablet Take 40 mg by mouth daily.   Yes [provider]  tirzepatide CLOYDE) 5 MG/0.5ML Pen Inject 5 mg into the skin once a week.   Yes [provider]  icosapent Ethyl (VASCEPA) 1 g capsule Take 2 g by mouth 2 (two) times daily.    [provider]    Allergies: Patient has no known allergies.    Review of Systems  All other systems reviewed and are negative.   Updated Vital  Signs BP (!) 166/88 (BP Location: Right Arm)   Pulse 81   Temp 98 F (36.7 C) (Oral)   Resp 18   Ht 5' 8 (1.727 m)   Wt 124.7 kg   SpO2 99%   BMI 41.81 kg/m   Physical Exam Vitals and nursing note reviewed.  Constitutional:      General: He is not in acute distress.    Appearance: He is well-developed.  HENT:     Head: Normocephalic and atraumatic.  Eyes:     Conjunctiva/sclera: Conjunctivae normal.  Cardiovascular:     Rate and Rhythm: Normal rate and regular rhythm.     Heart sounds: No murmur heard. Pulmonary:     Effort: Pulmonary effort is normal. No respiratory distress.     Breath sounds: Normal breath sounds. No wheezing, rhonchi or rales.  Abdominal:     Palpations: Abdomen is soft.     Tenderness: There is no abdominal tenderness.  Musculoskeletal:        General: No swelling.     Cervical back: Neck supple.  Skin:    General: Skin is warm and dry.     Capillary Refill: Capillary refill takes less than 2 seconds.  Neurological:     Mental Status: He is alert.  Psychiatric:        Mood and Affect: Mood normal.     (all labs ordered are listed, but only abnormal results are displayed) Labs Reviewed - No data to display  EKG: None  Radiology:  No results found.   Procedures   Medications Ordered in the ED - No data to display                                  Medical Decision Making Amount and/or Complexity of Data Reviewed Radiology: ordered.   This patient presents to the ED for concern of possible aspiration, this involves an extensive number of treatment options, and is a complaint that carries with it a high risk of complications and morbidity.  The differential diagnosis includes aspiration, pneumonia, pneumothorax, other   Co morbidities that complicate the patient evaluation  See HPI   Additional history obtained:  Additional history obtained from EMR External records from outside source obtained and reviewed including hospital  records   Lab Tests:  N/a   Imaging Studies ordered:  I ordered imaging studies including chest x-ray I independently visualized and interpreted imaging which showed no acute abnormalities I agree with the radiologist interpretation   Cardiac Monitoring: / EKG:  N/a   Consultations Obtained:  N/a   Problem List / ED Course / Critical interventions / Medication management  Possible aspiration Reevaluation of the patient showed that the patient stayed the same I have reviewed the patients home medicines and have made adjustments as needed   Social Determinants of Health:  Denies tobacco, illicit drug use.   Test / Admission - Considered:  Possible aspiration Vitals signs significant for hypertension blood pressure 166/88. Otherwise within normal range and stable throughout visit. Imaging studies significant for: See above 43 year old male presents emergency department with concern for possible aspiration.  1 hour ago was taking a probiotic supplement when he felt like he may have aspirated after looking up something online.  States that he did not have a coughing episode afterwards but tried to cough to clear anything that he may have accidentally aspirated.  Denies any fever, cough, chest pain, shortness of breath, abdominal pain, nausea, vomiting.  States that he currently has no symptoms.  Presents the ED for assessment/evaluation. On exam, lungs clear to oxygen bilaterally.  No abdominal tenderness.  Patient persistent/excessive coughing.  Seem to only have coughing after he looked up potential aspiration and tried to cough up the medication.  Chest x-ray performed by triage staff was negative.  Patient did have the medication With Him Which Was a Prebiotic.  I have a low clinical suspicion that patient aspirated prebiotic supplement given course of patient's symptoms.  Will recommend follow-up with primary care in the outpatient setting.  Treatment plan discussed with  patient and he would understanding was agreeable to said plan.  Patient well-appearing, afebrile in no acute distress. Worrisome signs and symptoms were discussed with the patient, and the patient acknowledged understanding to return to the ED if noticed. Patient was stable upon discharge.       Final diagnoses:  None    ED Discharge Orders     None          Silver Wonda LABOR, [place] 05/06/24 1434    Dasie Faden, MD 05/07/24 [phone]
